# Patient Record
Sex: Male | Born: 1990 | Race: White | Hispanic: No | Marital: Single | Smoking: Never smoker
Health system: Southern US, Community
[De-identification: ages and names within clinical notes are randomized; demographics above are authoritative.]

---
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
## 2012-07-27 ENCOUNTER — Emergency Department (HOSPITAL_COMMUNITY)
Admission: EM | Admit: 2012-07-27 | Discharge: 2012-07-27 | Disposition: A | Discharge status: Home / Self Care | Payer: BC Managed Care – PPO | Source: Home / Self Care

## 2012-07-27 ENCOUNTER — Encounter (HOSPITAL_COMMUNITY): Payer: Self-pay | Admitting: Emergency Medicine

## 2012-07-27 DIAGNOSIS — R21 Rash and other nonspecific skin eruption: Secondary | ICD-10-CM

## 2012-07-27 MED ORDER — PERMETHRIN 5 % EX CREA: TOPICAL_CREAM | CUTANEOUS | Status: AC

## 2012-07-27 MED ORDER — METHYLPREDNISOLONE 4 MG PO KIT: PACK | ORAL | Status: AC

## 2012-07-27 MED ORDER — TRIAMCINOLONE ACETONIDE 40 MG/ML IJ SUSP: 40.0000 mg | Freq: Once | INTRAMUSCULAR | Status: DC

## 2012-07-27 NOTE — ED Notes (Signed)
Reports rash on body which started two weeks ago.  Patient has put OTC cream but no relief.  Reports itching.  Mom reports no changes in lifestyle.

## 2012-07-27 NOTE — ED Provider Notes (Signed)
History     CSN: [id number]  Arrival date & time 07/27/12  1752   None     Chief Complaint  Patient presents with  . Rash    (Consider location/radiation/quality/duration/timing/severity/associated sxs/prior treatment) HPI Comments: 21 year old healthy male presents with a rash for 2 weeks. The rash consists of small papules it started primarily on the wrist into the hands and then became generalized. There are flesh-colored and red papules on the torso and extremities. They are highly pruritic. He states he is unsure of lot may have caused it or is unaware of any known exposure. He denies sick or ill behavior. His systems essentially negative  Patient is a 21 y.o. male presenting with rash.  Rash     History reviewed. No pertinent past medical history.  History reviewed. No pertinent past surgical history.  History reviewed. No pertinent family history.  History  Substance Use Topics  . Smoking status: Never Smoker   . Smokeless tobacco: Not on file  . Alcohol Use: No      Review of Systems  Skin: Positive for rash.  All other systems reviewed and are negative.    Allergies  Review of patient's allergies indicates no known allergies.  Home Medications   Current Outpatient Rx  Name  Route  Sig  Dispense  Refill  . METHYLPREDNISOLONE 4 MG PO KIT      Take as directed   21 tablet   0   . PERMETHRIN 5 % EX CREA      Apply from chin down, leave on for 8-14 hours, rinse. Repeat in 1 week   60 g   1     BP 110/62  Pulse 88  Temp 98.3 F (36.8 C) (Oral)  Resp 16  SpO2 98%  Physical Exam  Constitutional: He is oriented to person, place, and time. He appears well-developed and well-nourished. No distress.  HENT:  Head: Normocephalic and atraumatic.  Eyes: Conjunctivae normal and EOM are normal.  Neck: Normal range of motion. Neck supple.  Cardiovascular: Regular rhythm.   Pulmonary/Chest: Effort normal and breath sounds normal.  Musculoskeletal:  Normal range of motion. He exhibits no edema and no tenderness.  Lymphadenopathy:    He has no cervical adenopathy.  Neurological: He is alert and oriented to person, place, and time. No cranial nerve deficit.  Skin: Skin is warm and dry. Rash noted.       Papular rash to extremities and torso as described above.  Psychiatric: He has a normal mood and affect.    ED Course  Procedures (including critical care time)  Labs Reviewed - No data to display No results found.   1. Rash       MDM  Am uncertain as to the etiology of the rash. Initially it looks like scabies on the extremities but not so much on the torso. No one else in the home has this rash. And he lives with his family. It also appears similar to a contact dermatitis. Kenalog 40 mg IM now Medrol Dosepak. System not sure of the rash etiology and it does appear somewhat like scabies I will give prescription for Elimite cream for him to apply an approximately 1 week if he is not improving.        Hayden Rasmussen, NP 07/27/12 2022

## 2012-07-28 NOTE — ED Provider Notes (Signed)
Medical screening examination/treatment/procedure(s) were performed by resident physician or non-physician practitioner and as supervising physician I was immediately available for consultation/collaboration.   Emmarae Cowdery DOUGLAS MD.    Yussuf Sawyers D Mirella Gueye, MD 07/28/12 1045 

## 2018-08-28 ENCOUNTER — Ambulatory Visit
Admission: RE | Admit: 2018-08-28 | Discharge: 2018-08-28 | Disposition: A | Discharge status: Home / Self Care | Payer: Self-pay | Source: Ambulatory Visit | Attending: Nurse Practitioner | Admitting: Nurse Practitioner

## 2018-08-28 ENCOUNTER — Other Ambulatory Visit: Payer: Self-pay | Admitting: Nurse Practitioner

## 2018-08-28 DIAGNOSIS — Z021 Encounter for pre-employment examination: Secondary | ICD-10-CM

## 2020-08-11 IMAGING — CR DG CHEST 1V
1 series · 1 of 1 positions shown · non-contrast
Comparison: None.

CLINICAL DATA: Pre plum it x-ray

EXAM:
CHEST  1 VIEW

[w chest pa]
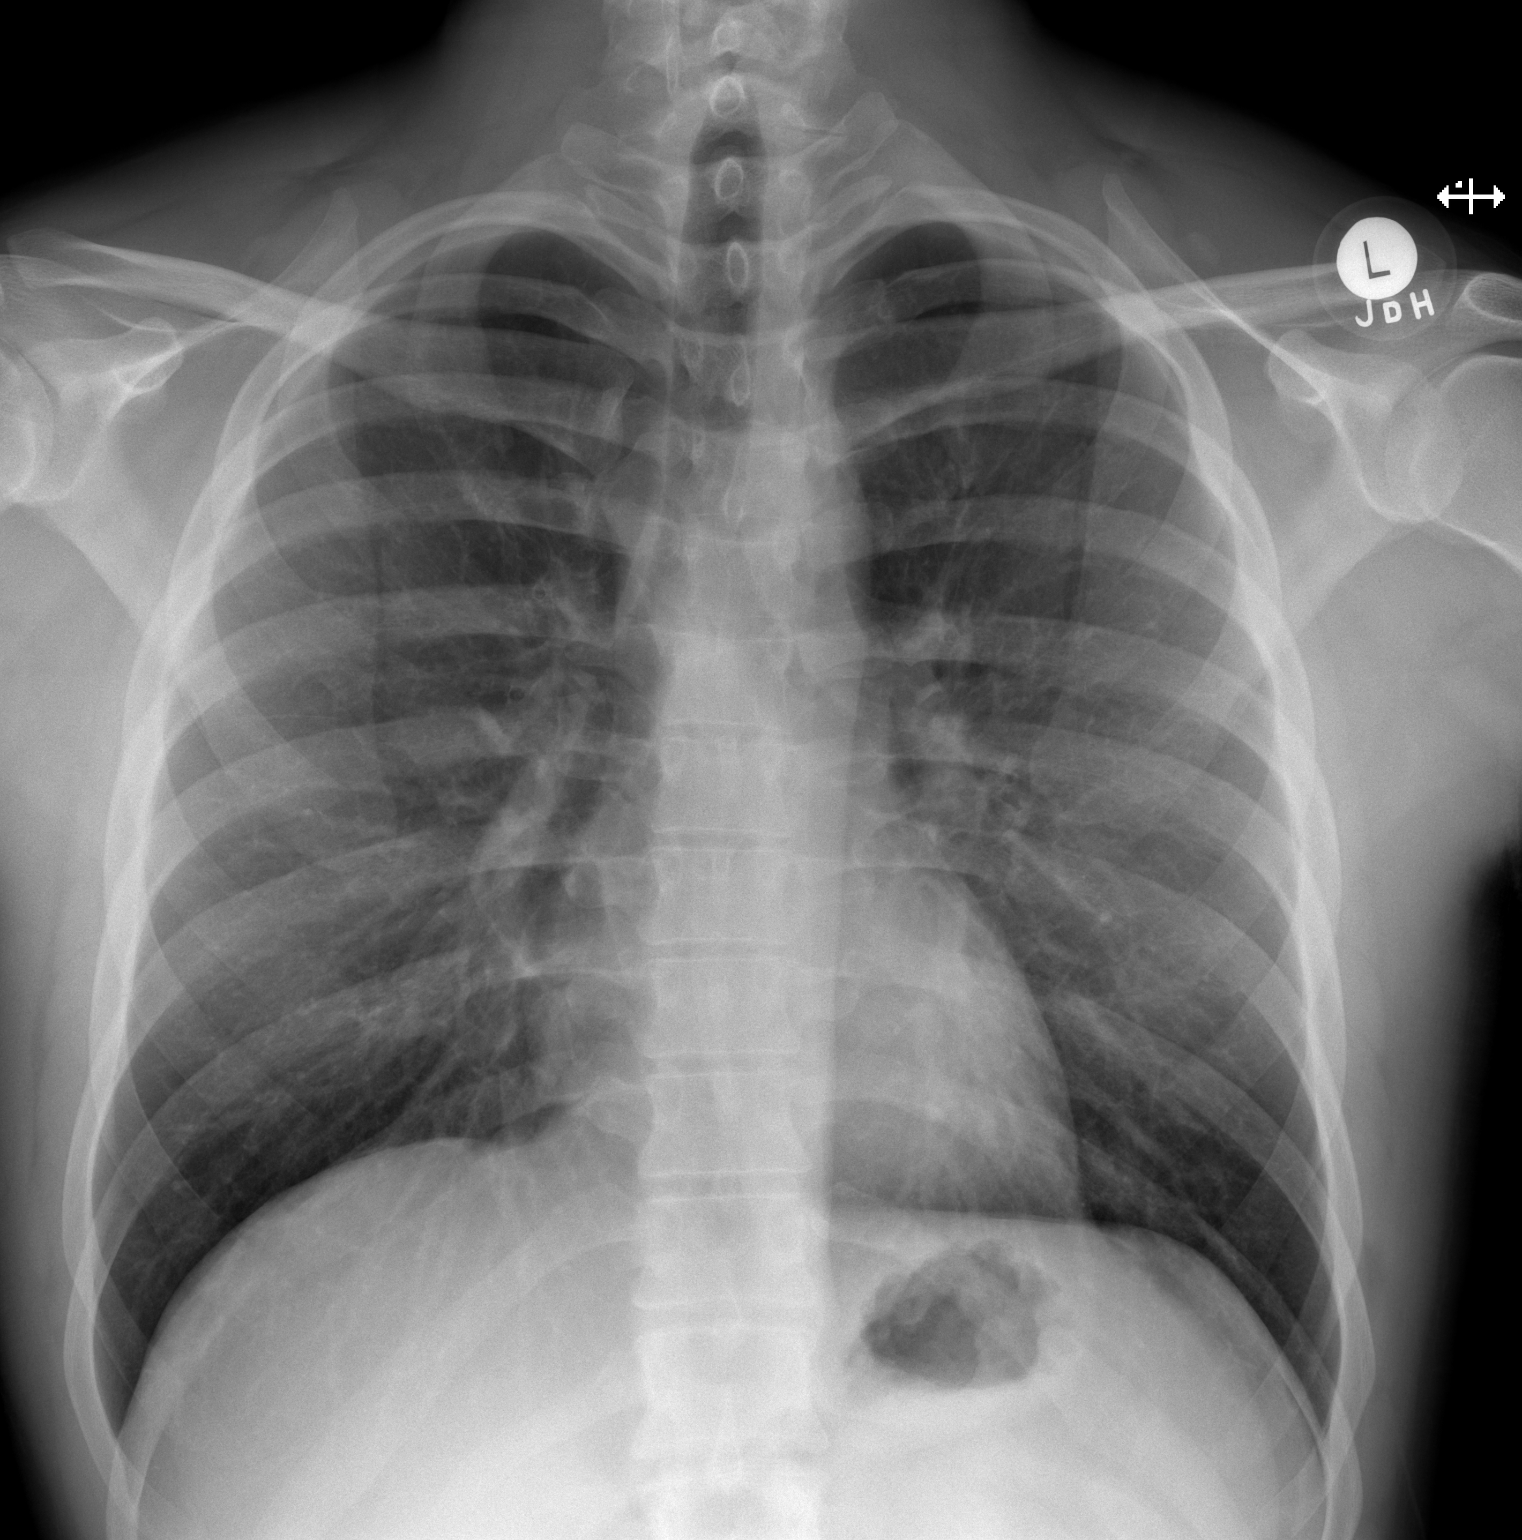

[1 of 1 positions shown; findings below may reference images not displayed]

FINDINGS: The heart size and mediastinal contours are within normal limits.
Both lungs are clear. The visualized skeletal structures are
unremarkable.
IMPRESSION: No active disease.

## 2022-02-05 ENCOUNTER — Other Ambulatory Visit: Payer: Self-pay

## 2022-02-05 ENCOUNTER — Encounter: Payer: Self-pay | Admitting: Emergency Medicine

## 2022-02-05 ENCOUNTER — Ambulatory Visit
Admission: EM | Admit: 2022-02-05 | Discharge: 2022-02-05 | Disposition: A | Discharge status: Home / Self Care | Payer: No Typology Code available for payment source | Attending: Internal Medicine | Admitting: Internal Medicine

## 2022-02-05 DIAGNOSIS — H65194 Other acute nonsuppurative otitis media, recurrent, right ear: Secondary | ICD-10-CM | POA: Insufficient documentation

## 2022-02-05 DIAGNOSIS — J029 Acute pharyngitis, unspecified: Secondary | ICD-10-CM | POA: Insufficient documentation

## 2022-02-05 DIAGNOSIS — J069 Acute upper respiratory infection, unspecified: Secondary | ICD-10-CM | POA: Insufficient documentation

## 2022-02-05 LAB — POCT RAPID STREP A (OFFICE): Rapid Strep A Screen: NEGATIVE

## 2022-02-05 MED ORDER — AMOXICILLIN 875 MG PO TABS: 875.0000 mg | ORAL_TABLET | Freq: Two times a day (BID) | ORAL | 0 refills | Status: AC

## 2022-02-05 MED ORDER — ACETAMINOPHEN 325 MG PO TABS
650.0000 mg | ORAL_TABLET | Freq: Once | ORAL | Status: AC
  Administered 2022-02-05: 650 mg via ORAL

## 2022-02-05 NOTE — ED Provider Notes (Addendum)
[facility]    CSN: [id number] Arrival date & time: 02/05/22  [id number]      History   Chief Complaint Chief Complaint  Patient presents with   Fever   Cough    HPI Robert Zavala is a 31 y.o. male.   Patient presents with cough, nasal congestion, body aches, bilateral ear pain, sore throat that started yesterday.  He reports that his daughter recently tested positive for RSV.  Patient not sure if any fevers were present at home.  Denies chest pain, shortness of breath, nausea, vomiting, diarrhea, abdominal pain.   Fever Cough  History reviewed. No pertinent past medical history.  There are no problems to display for this patient.   History reviewed. No pertinent surgical history.     Home Medications    Prior to Admission medications   Medication Sig Start Date End Date Taking? Authorizing Provider  amoxicillin (AMOXIL) 875 MG tablet Take 1 tablet (875 mg total) by mouth 2 (two) times daily for 10 days. 02/05/22 02/15/22 Yes Carleah Yablonski, Michele Rockers, FNP  methylPREDNISolone (MEDROL DOSEPAK) 4 MG tablet Take as directed Patient not taking: Reported on 02/05/2022 07/27/12   Janne Napoleon, NP  permethrin (ELIMITE) 5 % cream Apply from chin down, leave on for 8-14 hours, rinse. Repeat in 1 week 07/27/12   Janne Napoleon, NP    Family History History reviewed. No pertinent family history.  Social History Social History   Tobacco Use   Smoking status: Never  Substance Use Topics   Alcohol use: No     Allergies   Patient has no known allergies.   Review of Systems Review of Systems Per HPI  Physical Exam Triage Vital Signs ED Triage Vitals [02/05/22 1008]  Enc Vitals Group     BP 108/64     Pulse Rate (!) 130     Resp 18     Temp (!) 103.1 F (39.5 C)     Temp Source Oral     SpO2 95 %     Weight      Height      Head Circumference      Peak Flow      Pain Score 6     Pain Loc      Pain Edu?      Excl. in [place]?    No data  found.  Updated Vital Signs BP 108/64 (BP Location: Left Arm)   Pulse (!) 130   Temp (!) 102.5 F (39.2 C) (Oral)   Resp 18   SpO2 95%   Visual Acuity Right Eye Distance:   Left Eye Distance:   Bilateral Distance:    Right Eye Near:   Left Eye Near:    Bilateral Near:     Physical Exam Constitutional:      General: He is not in acute distress.    Appearance: Normal appearance. He is not toxic-appearing or diaphoretic.  HENT:     Head: Normocephalic and atraumatic.     Right Ear: Ear canal normal. Tympanic membrane is erythematous. Tympanic membrane is not perforated or bulging.     Left Ear: Tympanic membrane and ear canal normal.     Nose: Congestion present.     Mouth/Throat:     Mouth: Mucous membranes are moist.     Pharynx: Posterior oropharyngeal erythema present.  Eyes:     Extraocular Movements: Extraocular movements intact.     Conjunctiva/sclera: Conjunctivae normal.     Pupils: Pupils are  equal, round, and reactive to light.  Cardiovascular:     Rate and Rhythm: Normal rate and regular rhythm.     Pulses: Normal pulses.     Heart sounds: Normal heart sounds.  Pulmonary:     Effort: Pulmonary effort is normal. No respiratory distress.     Breath sounds: Normal breath sounds. No wheezing.  Abdominal:     General: Abdomen is flat. Bowel sounds are normal.     Palpations: Abdomen is soft.  Musculoskeletal:        General: Normal range of motion.     Cervical back: Normal range of motion.  Skin:    General: Skin is warm and dry.  Neurological:     General: No focal deficit present.     Mental Status: He is alert and oriented to person, place, and time. Mental status is at baseline.  Psychiatric:        Mood and Affect: Mood normal.        Behavior: Behavior normal.     UC Treatments / Results  Labs (all labs ordered are listed, but only abnormal results are displayed) Labs Reviewed  CULTURE, GROUP A STREP ([facility])  COVID-19, FLU A+B AND RSV  POCT  RAPID STREP A (OFFICE)    EKG   Radiology No results found.  Procedures Procedures (including critical care time)  Medications Ordered in UC Medications  acetaminophen (TYLENOL) tablet 650 mg (650 mg Oral Given 02/05/22 1018)    Initial Impression / Assessment and Plan / UC Course  I have reviewed the triage vital signs and the nursing notes.  Pertinent labs & imaging results that were available during my care of the patient were reviewed by me and considered in my medical decision making (see chart for details).     Patient presents with symptoms likely from a viral upper respiratory infection. Differential includes bacterial pneumonia, sinusitis, allergic rhinitis, COVID-19, flu, RSV. Do not suspect underlying cardiopulmonary process. Symptoms seem unlikely related to ACS, CHF or COPD exacerbations, pneumonia, pneumothorax. Patient is nontoxic appearing and not in need of emergent medical intervention.  Highly suspicious of RSV given patient's close exposure.  Although, with patient's appearance to posterior pharynx on exam and high fever, strep and other testing were completed to ensure that coexisting and secondary infections were not present.  Rapid strep was negative.  COVID, flu, RSV test pending.  Recommended symptom control with over the counter medications: Daily oral anti-histamine, Oral decongestant or IN corticosteroid, saline irrigations, cepacol lozenges, Robitussin, Delsym, honey tea.  Amoxicillin to treat ear infection.  Tylenol administered in urgent care today with slight improvement in temperature.  Patient encouraged to monitor fever at home and treat as appropriate with antipyretics. Suspect tachycardia is related to fever. Patient is stable.   Return if symptoms fail to improve in 1-2 weeks or you develop shortness of breath, chest pain, severe headache. Patient states understanding and is agreeable.  Discharged with PCP followup.  Final Clinical Impressions(s) /  UC Diagnoses   Final diagnoses:  Other recurrent acute nonsuppurative otitis media of right ear  Viral upper respiratory tract infection with cough  Sore throat     Discharge Instructions      You likely having a viral upper respiratory infection. We recommended symptom control. I expect your symptoms to start improving in the next 1-2 weeks.   1. Take a daily allergy pill/anti-histamine like Zyrtec, Claritin, or Store brand consistently for 2 weeks  2. For congestion you may try  an oral decongestant like Mucinex or sudafed. You may also try intranasal flonase nasal spray or saline irrigations (neti pot, sinus cleanse)  3. For your sore throat you may try cepacol lozenges, salt water gargles, throat spray. Treatment of congestion may also help your sore throat.  4. For cough you may try Robitussen, Mucinex DM  5. Take Tylenol or Ibuprofen to help with pain/inflammation  6. Stay hydrated, drink plenty of fluids to keep throat coated and less irritated  Honey Tea For cough/sore throat try using a honey-based tea. Use 3 teaspoons of honey with juice squeezed from half lemon. Place shaved pieces of ginger into 1/2-1 cup of water and warm over stove top. Then mix the ingredients and repeat every 4 hours as needed.   I am highly suspicious that you have RSV given your close exposure.  Rapid strep was negative.  Throat culture, COVID-19, flu, RSV test are pending.  We will call if they are positive.  Please monitor fever very closely and take Tylenol as needed for fever.  You have been prescribed antibiotic for ear infection.    ED Prescriptions     Medication Sig Dispense Auth. Provider   amoxicillin (AMOXIL) 875 MG tablet Take 1 tablet (875 mg total) by mouth 2 (two) times daily for 10 days. 20 tablet [place], Michele Rockers, [place]      PDMP not reviewed this encounter.   Teodora Medici, [place] 02/05/22 [place], [place], [place] 02/05/22 1059

## 2022-02-05 NOTE — Discharge Instructions (Addendum)
You likely having a viral upper respiratory infection. We recommended symptom control. I expect your symptoms to start improving in the next 1-2 weeks.   1. Take a daily allergy pill/anti-histamine like Zyrtec, Claritin, or Store brand consistently for 2 weeks  2. For congestion you may try an oral decongestant like Mucinex or sudafed. You may also try intranasal flonase nasal spray or saline irrigations (neti pot, sinus cleanse)  3. For your sore throat you may try cepacol lozenges, salt water gargles, throat spray. Treatment of congestion may also help your sore throat.  4. For cough you may try Robitussen, Mucinex DM  5. Take Tylenol or Ibuprofen to help with pain/inflammation  6. Stay hydrated, drink plenty of fluids to keep throat coated and less irritated  Honey Tea For cough/sore throat try using a honey-based tea. Use 3 teaspoons of honey with juice squeezed from half lemon. Place shaved pieces of ginger into 1/2-1 cup of water and warm over stove top. Then mix the ingredients and repeat every 4 hours as needed.   I am highly suspicious that you have RSV given your close exposure.  Rapid strep was negative.  Throat culture, COVID-19, flu, RSV test are pending.  We will call if they are positive.  Please monitor fever very closely and take Tylenol as needed for fever.  You have been prescribed antibiotic for ear infection.

## 2022-02-05 NOTE — ED Triage Notes (Signed)
Pt here for cough and fever with body aches and ear pain starting yesterday

## 2022-02-06 LAB — COVID-19, FLU A+B AND RSV
Influenza A, NAA: NOT DETECTED
Influenza B, NAA: NOT DETECTED
RSV, NAA: NOT DETECTED
SARS-CoV-2, NAA: NOT DETECTED

## 2022-02-08 LAB — CULTURE, GROUP A STREP (THRC)
